# Patient Record
Sex: Male | Born: 1955 | Race: White | Hispanic: No | Marital: Married
Health system: Southern US, Community
[De-identification: ages and names within clinical notes are randomized; demographics above are authoritative.]

---

## 2008-10-11 ENCOUNTER — Ambulatory Visit: Payer: Self-pay | Admitting: Cardiology

## 2008-10-11 ENCOUNTER — Emergency Department (HOSPITAL_COMMUNITY): Admission: EM | Admit: 2008-10-11 | Discharge: 2008-10-11 | Payer: Self-pay | Admitting: Emergency Medicine

## 2008-10-17 ENCOUNTER — Telehealth: Payer: Self-pay | Admitting: Cardiology

## 2008-10-21 ENCOUNTER — Encounter: Payer: Self-pay | Admitting: Cardiology

## 2008-10-21 ENCOUNTER — Ambulatory Visit: Payer: Self-pay

## 2008-10-23 ENCOUNTER — Observation Stay (HOSPITAL_COMMUNITY): Admission: EM | Admit: 2008-10-23 | Discharge: 2008-10-24 | Payer: Self-pay | Admitting: Emergency Medicine

## 2008-10-24 ENCOUNTER — Ambulatory Visit: Payer: Self-pay

## 2008-10-24 ENCOUNTER — Encounter: Payer: Self-pay | Admitting: Cardiovascular Disease

## 2008-10-27 ENCOUNTER — Telehealth: Payer: Self-pay | Admitting: Cardiology

## 2008-10-30 ENCOUNTER — Ambulatory Visit: Payer: Self-pay | Admitting: Cardiology

## 2008-10-30 ENCOUNTER — Encounter (INDEPENDENT_AMBULATORY_CARE_PROVIDER_SITE_OTHER): Payer: Self-pay | Admitting: *Deleted

## 2008-10-30 DIAGNOSIS — I10 Essential (primary) hypertension: Secondary | ICD-10-CM | POA: Insufficient documentation

## 2008-10-30 DIAGNOSIS — R55 Syncope and collapse: Secondary | ICD-10-CM | POA: Insufficient documentation

## 2008-10-30 DIAGNOSIS — R079 Chest pain, unspecified: Secondary | ICD-10-CM | POA: Insufficient documentation

## 2008-10-31 ENCOUNTER — Ambulatory Visit: Payer: Self-pay | Admitting: Cardiology

## 2008-10-31 ENCOUNTER — Encounter: Payer: Self-pay | Admitting: Cardiology

## 2008-11-03 LAB — CONVERTED CEMR LAB
Albumin: 4.3 g/dL (ref 3.5–5.2)
Alkaline Phosphatase: 48 units/L (ref 39–117)
BUN: 16 mg/dL (ref 6–23)
Basophils Absolute: 0 10*3/uL (ref 0.0–0.1)
Basophils Relative: 0 % (ref 0–1)
Bilirubin, Direct: 0.2 mg/dL (ref 0.0–0.3)
CO2: 27 meq/L (ref 19–32)
Chloride: 105 meq/L (ref 96–112)
Creatinine, Ser: 0.9 mg/dL (ref 0.40–1.50)
Eosinophils Absolute: 0.1 10*3/uL (ref 0.0–0.7)
Eosinophils Relative: 1 % (ref 0–5)
Glucose, Bld: 95 mg/dL (ref 70–99)
HCT: 41.5 % (ref 39.0–52.0)
HDL: 71 mg/dL (ref >39)
Hemoglobin: 14 g/dL (ref 13.0–17.0)
INR: 1.1 (ref 0.0–1.5)
LDL Cholesterol: 88 mg/dL (ref 0–99)
MCHC: 33.7 g/dL (ref 30.0–36.0)
Monocytes Absolute: 0.6 10*3/uL (ref 0.1–1.0)
Platelets: 297 10*3/uL (ref 150–400)
RDW: 12.3 % (ref 11.5–15.5)
Total Bilirubin: 1.1 mg/dL (ref 0.3–1.2)

## 2008-11-07 ENCOUNTER — Inpatient Hospital Stay (HOSPITAL_BASED_OUTPATIENT_CLINIC_OR_DEPARTMENT_OTHER): Admission: RE | Admit: 2008-11-07 | Discharge: 2008-11-07 | Payer: Self-pay | Admitting: Cardiology

## 2008-11-07 ENCOUNTER — Ambulatory Visit: Payer: Self-pay | Admitting: Cardiology

## 2008-11-19 ENCOUNTER — Ambulatory Visit: Payer: Self-pay | Admitting: Cardiology

## 2010-07-18 LAB — POCT CARDIAC MARKERS
CKMB, poc: <1 ng/mL — ABNORMAL LOW (ref 1.0–8.0)
CKMB, poc: <1 ng/mL — ABNORMAL LOW (ref 1.0–8.0)
Troponin i, poc: <0.05 ng/mL (ref 0.00–0.09)

## 2010-07-19 LAB — DIFFERENTIAL
Basophils Absolute: 0 10*3/uL (ref 0.0–0.1)
Basophils Relative: 0 % (ref 0–1)
Eosinophils Relative: 1 % (ref 0–5)
Eosinophils Relative: 2 % (ref 0–5)
Lymphocytes Relative: 17 % (ref 12–46)
Lymphs Abs: 1.4 10*3/uL (ref 0.7–4.0)
Monocytes Absolute: 0.2 10*3/uL (ref 0.1–1.0)
Monocytes Absolute: 0.8 10*3/uL (ref 0.1–1.0)
Neutro Abs: 5.9 10*3/uL (ref 1.7–7.7)

## 2010-07-19 LAB — POCT I-STAT, CHEM 8
Calcium, Ion: 1.19 mmol/L (ref 1.12–1.32)
HCT: 44 % (ref 39.0–52.0)
Hemoglobin: 15 g/dL (ref 13.0–17.0)
TCO2: 28 mmol/L (ref 0–100)

## 2010-07-19 LAB — CBC
HCT: 41.2 % (ref 39.0–52.0)
HCT: 43.4 % (ref 39.0–52.0)
Hemoglobin: 14.4 g/dL (ref 13.0–17.0)
Hemoglobin: 14.8 g/dL (ref 13.0–17.0)
MCHC: 34 g/dL (ref 30.0–36.0)
Platelets: 317 10*3/uL (ref 150–400)
RDW: 12.1 % (ref 11.5–15.5)
WBC: 8.2 10*3/uL (ref 4.0–10.5)

## 2010-07-19 LAB — POCT CARDIAC MARKERS
Myoglobin, poc: 75.1 ng/mL (ref 12–200)
Troponin i, poc: <0.05 ng/mL (ref 0.00–0.09)

## 2010-07-19 LAB — PROTIME-INR: Prothrombin Time: 15.3 seconds — ABNORMAL HIGH (ref 11.6–15.2)

## 2010-07-19 LAB — APTT: aPTT: 30 seconds (ref 24–37)

## 2010-07-23 IMAGING — CT CT HEART MORP W/ CTA COR W/ SCORE W/ CA W/CM &/OR W/O CM
2 of 5 series · 11 of 20 positions shown, 12 images · IV contrast (omnipaque)
Comparison: Plain film of yesterday.

INDICATION: Chest discomfort

CT ANGIOGRAPHY OF THE HEART, CORONOARY ARTERY, STRUCTURE, AND
MORPHOLOGY
CONTRAST:  100 ml Omnipaque 350
TECHNIQUE: CT angiography of the coronary vessels was performed on
a 64 - channel system using ECG gating.  A scout and  noncontrast
exam (for calcium scoring) were performed.  Circulation time was
measured using a test bolus.  Coronary CTA was performed with sub
mm slice collimation during portions of the cardiac cycle after
prior injection of iodinated contrast.  Imaging post processing was
performed on an independent workstation creating multiplanar, 3-D,
and quantitative analysis of the heart and coronary arteries.  Note
that this exam targets the heart and the chest was not imaged in
its entirety.
PREMEDICATION:
Lopressor 175 mg, P.O.
Lopressor 5 mg, IV
Nitroglycerin 0.4 mcg, sublingual.

[Series 3: calcium score 3.0 b35f 65% · axial · 0.49mm/px · z∈[-360,-130]mm · 3 of 78 slices shown, 4 images]
[im 1/78  vessel]
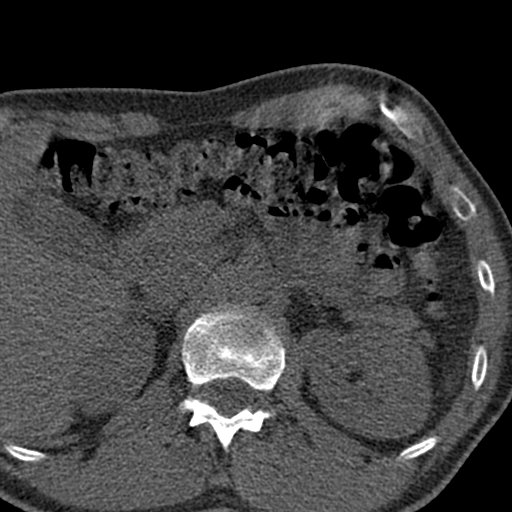
[im 1/78  lung]
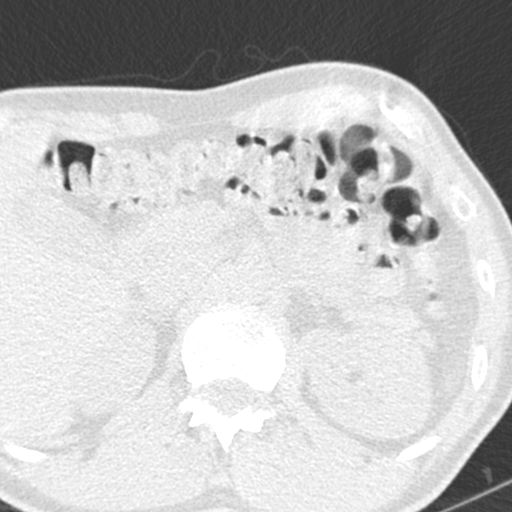
[im 39/78  vessel]
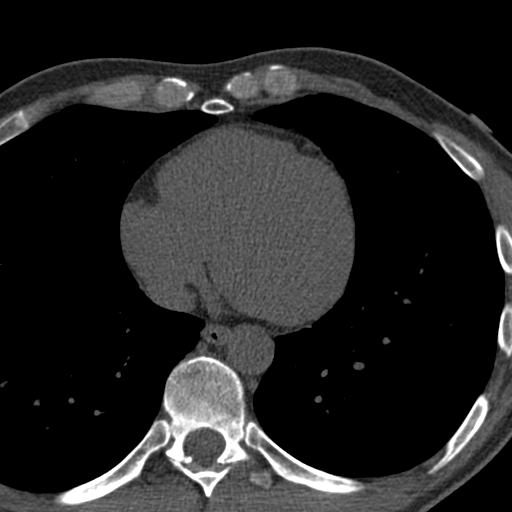
[im 78/78  vessel]
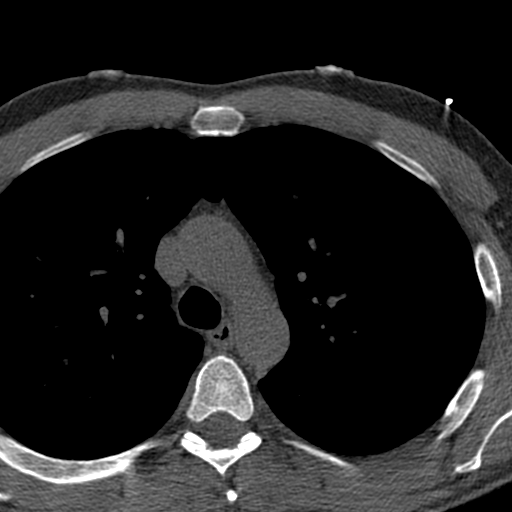

[Series 6: coronary cta 0.6 b25f 65% · axial · 0.47mm/px · z∈[-321,-175]mm · 8 of 300 slices shown]
[im 28/300  vessel]
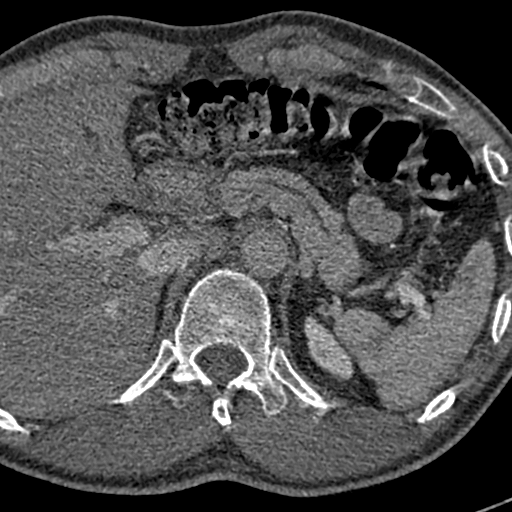
[im 55/300  vessel]
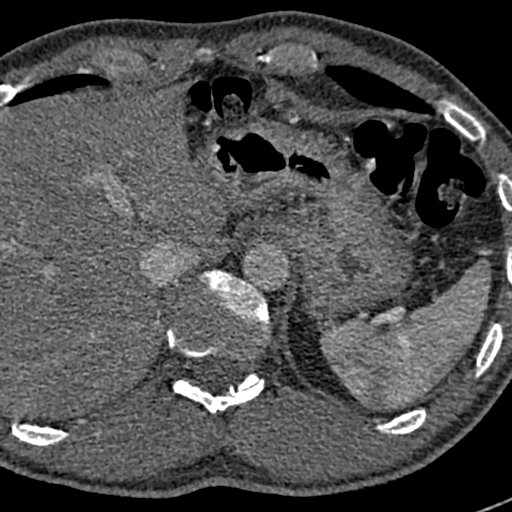
[im 109/300  vessel]
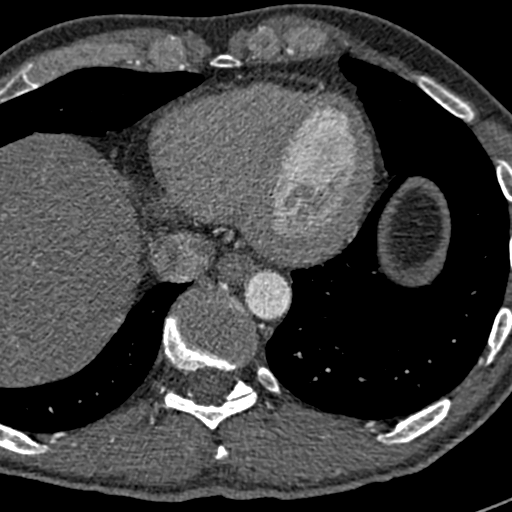
[im 136/300  vessel]
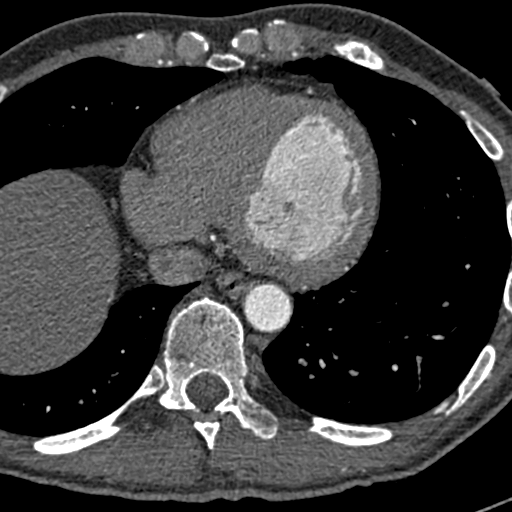
[im 164/300  vessel]
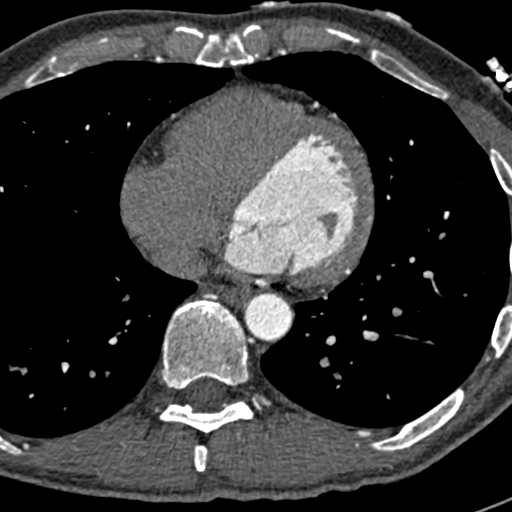
[im 191/300  vessel]
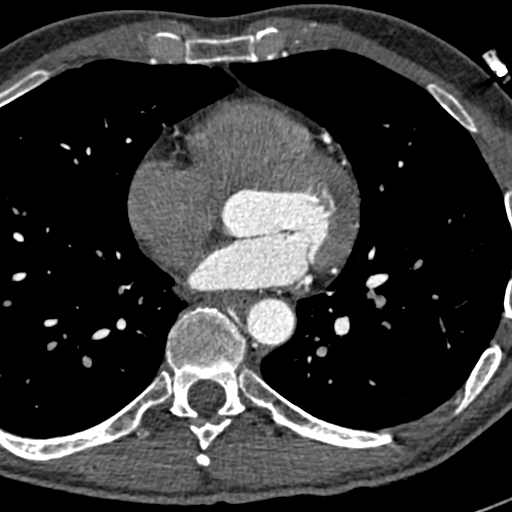
[im 245/300  vessel]
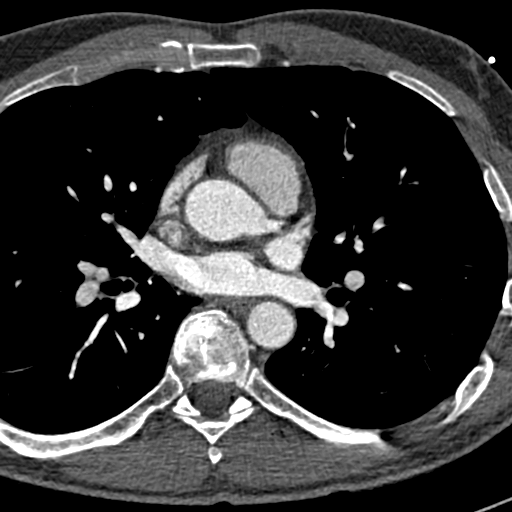
[im 272/300  vessel]
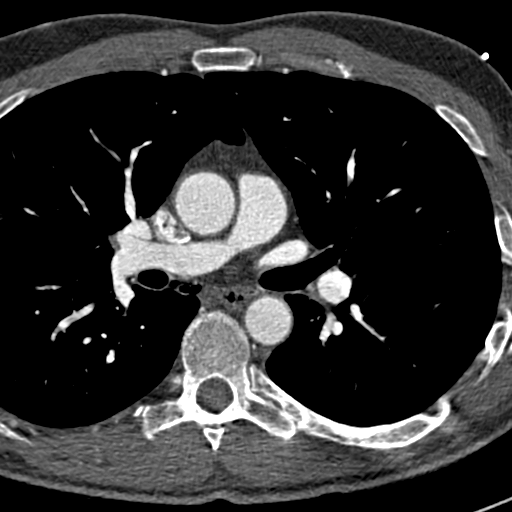

[11 of 20 positions shown; findings below may reference images not displayed]

FINDINGS: Technical quality:  Moderate

CORONARY ARTERIES:
Left main coronary artery:  A short vessel which arises from the
left coronary cusp.  No evidence of significant stenosis.  In the
region of the bifurcation into the LAD and left circumflex, there
is an area of heterogeneous density which is felt to represent
misregistration artifact.
Left anterior descending:  Long vessel which supplies cardiac apex.
Numerous foci of misregistration artifact, due to the patient's
high heart rate,.  Other than these areas of apparent irregularity,
no evidence of significant stenosis.

There is one tiny proximal diagonal branch and a moderate sized D2
branch.  The latter is branching,  also has areas of mild
misregistration but no significant stenosis. The small ramus branch
is also mildly artifact limited.  No significant stenosis
identified.
Left circumflex:  A large, dominant vessel.  No significant
misregistration artifact throughout its proximal to mid course.
Apparent irregularity about the inferior distal course is due to
misregistration artifact.  See PACS image. Supplies numerous
posterolateral branches.

Gives rise to a proximal moderate to large marginal branch.  This
supplies a portion of the left ventricular free wall and is without
significant stenosis proximally.  More distally, it is patent but
degraded by misregistration artifact.
Right coronary artery:  A small nondominant vessel.  Arises
normally from the right coronary cusp.  No significant stenosis in
its proximal portion.   Extensive artifact distally.  Within its
mid course gives rise to a marginal branch and continues as a
diminutive vessel in its mid to distal course.  The marginal branch
is small but patent.
Posterior descending artery:  Small moderate in size.  Supplied by
the distal left circ
Dominance:  Left-sided.

CORONARY CALCIUM:
Total Agatston Score:  0
[HOSPITAL] percentile:  0

CARDIAC MEASUREMENTS:
Interventricular septum (6 - 12 mm):  8
LV posterior wall (6 - 12 mm):  8
LV diameter in diastole (35 - 52 mm):  50

AORTA AND PULMONARY MEASUREMENTS:
Aortic root (21 - 40 mm):
            29  at the annulus
            36  at the sinuses of Valsalva
            26  at the sinotubular junction
Ascending aorta ( <  40 mm):  27
Descending aorta ( <  40 mm):  21
Main pulmonary artery:  ( <  30 mm):  21

EXTRACARDIAC FINDINGS:
Lung windows demonstrate minimal motion artifact.  No nodules
airspace opacities.

Soft tissue windows demonstrate No pericardial or pleural effusion.
No mediastinal or hilar adenopathy. No central pulmonary embolism.

Limited abdominal imaging demonstrates lateral segment left liver
lobe cyst which measures approximately 1 cm.  Under distended
proximal stomach.  No acute osseous abnormality. Underdistension of
the proximal stomach.  Cannot exclude mild distal esophageal and
proximal gastric wall thickening including on image 44 of series 7.
IMPRESSION: 1.  The patient's total coronary artery calcium score is 0, which
is 0 percentile for patient's matched age,  gender and
race/ethnicity.
2.  Moderately degraded exam due to numerous foci of
misregistration artifact.  This is secondary to patient's heart
rate, which is 72 during the exam, despite near maximal dose of
oral and IV Lopressor and 2 mg of Ativan.  The misregistration
artifact primarily involves the right coronary artery and mid to
distal LAD.  In the areas that are not degraded, there is no
evidence of significant stenosis.

3.  Left-sided coronary artery dominance.
4.  Possible distal esophageal and/or proximal gastric wall
thickening.  Correlate with if  patient's symptoms could be
attributed to esophagitis patchy gastritis.

I called and personally discussed this report with Dr. Sameena Rasmussen

## 2010-08-24 NOTE — Consult Note (Signed)
NAME:  William Holloway, William Holloway NO.:  [id number]   MEDICAL RECORD NO.:  [id number]          PATIENT TYPE:  EMS   LOCATION:  [facility]                         FACILITY:  [facility]   PHYSICIAN:  Marca Ancona, MD      DATE OF BIRTH:  February 01, 1956   DATE OF CONSULTATION:  10/11/2008  DATE OF DISCHARGE:  10/11/2008                                 CONSULTATION   PRIMARY CARE PHYSICIAN:  The patient does not have a primary care  physician.   HISTORY OF PRESENT ILLNESS:  This is a 55 year old with no significant  past medical history who came to the emergency department today after a  syncopal episode.  The patient went to ophthalmologist today for his  regular eye checkup.  He did not eat anything at all this morning.  They  puffed his eyes with air to check the glaucoma.  He found this  unpleasant and developed some nausea and ooziness after they did it.  After the exam, he stood up and the lightheadedness and ooziness that he  had got worse as he walked to the door of the room he passed out, fell  forward hitting his face and fracturing a nasal bone.  The patient was  unconscious only for a few seconds.  When EMS arrived, he was awake and  his heart rate was in the 40s.  Currently in the emergency department,  his heart rate in the 60-70, his blood pressure initially 180 and now it  is down to 120.  Telemetry has been normal in the emergency department.  His EKG is completely normal.  The patient feels fine now.  At baseline,  he has good exercise tolerance.  No chest pain or shortness of breath  with exertion.   MEDICATIONS:  None.   PAST MEDICAL HISTORY:  None related though the patient possibly has  hypertension.   SOCIAL HISTORY:  The patient lives in [place] with his wife.  He is an  Art gallery manager at [employer].  He does not smoke.  Rarely drinks alcohol.  No  drugs.   FAMILY HISTORY:  The patient's father had an MI at age of 71.  His  sister has hypertension.   REVIEW OF  SYSTEMS:  Negative except as stated in history of present  illness.   PHYSICAL EXAMINATION:  VITAL SIGNS:  The patient is afebrile.  Heart  rate is in the 60s-70s in sinus rhythm.  Blood pressure was initially  185/73, now 120/80. Oxygen saturation 100% on room air.  GENERAL:  No apparent distress.  HEENT:  The patient has some epistaxis from where he fell.  ABDOMEN:  Soft and nontender.  No hepatosplenomegaly.  Normal bowel  sounds.  NECK:  No thyromegaly.  No thyroid nodule.  There is no JVD.  CARDIOVASCULAR:  Heart, regular S1 and S2.  No S3.  There is soft S4.  There is no murmur.  Pulses are 2+ and equal without bruits.  EXTREMITY:  No clubbing or cyanosis.  No edema.  MUSCULOSKELETAL:  Normal exam.  LUNGS:  Clear to auscultation bilaterally.  NEUROLOGIC:  Alert and oriented x3.  Normal affect.   Radiology, CT maxillofacial shows nondisplaced nasal bone fracture.  CT  of the head shows no CNS bleeding.  EKG, normal sinus rhythm.  This is  normal EKG.  Labs:  Hematocrit 43.4, potassium 4.6, creatinine 1.1.  Cardiac enzymes were negative x1.   ASSESSMENT AND PLAN:  This 55 year old with no significant past medical  history presented after a syncopal episode.  1. Syncope is likely vasovagal that occurred with a prodrome after an      unpleasant sensation (puff of air in eyes).  Heart rate was in the      40s when the EMS arrived,  however, it has been normal in ER.      Telemetries has been normal and EKG is completely normal.  There is      no murmur on exam.  I think it is okay for the patient be      discharged home.  He can follow up as an outpatient.  He should      have an echocardiogram in 48-hour Holter monitor done as an      outpatient.  He can follow up in our office.  No driving for 3 days      to ensure no recurrence of syncope.  2. Hypertension.  The patient's blood pressure was initially as high      as 180 is now 120/80.  He carries no known history of  hypertension.      He does have an S4, this will need to be monitored closely.      Marca Ancona, MD  Electronically Signed     DM/MEDQ  D:  10/11/2008  T:  10/12/2008  Job:  [id number]

## 2010-08-24 NOTE — Cardiovascular Report (Signed)
NAME:  William Holloway, William Holloway NO.:  [id number]   MEDICAL RECORD NO.:  [id number]          PATIENT TYPE:  OIB   LOCATION:  1961                         FACILITY:  [facility]   PHYSICIAN:  Marca Ancona, MD      DATE OF BIRTH:  [birth year]   DATE OF PROCEDURE:  DATE OF DISCHARGE:                            CARDIAC CATHETERIZATION   PROCEDURE:  1. Left heart catheterization.  2. Coronary angiography  3. Left ventriculography.   INDICATIONS:  Chest pain and abnormal stress echo in a person with a  history of multiple episodes of syncope.   PROCEDURE NOTE:  After informed consent was obtained, the right groin  was sterilely prepped and draped.  A 1% lidocaine was used to locally  anesthetize the right groin area.  The right common femoral artery was  entered using Seldinger technique and a 4-French arterial sheath was  placed.  The left coronary artery was engaged with the 4-French JL-4  catheter.  The right coronary artery was engaged with the 4-French 3DRC  catheter and the left ventricle was entered using a 4-French angled  pigtail catheter.  There were no complications.   FINDINGS:  1. Hemodynamics:  LV 115/14, aorta 115/70.  2. Left ventriculography:  EF was 60%.  There is no mitral      regurgitation.  There is normal wall motion.  3. Coronary artery system:  The coronary system was left dominant.      There is a large left PDA.  The RCA was a small nondominant vessel.      The left main was a short vessel.  There was no angiographic      coronary artery disease.   ASSESSMENT:  No angiographic coronary disease.  Normal left ventricular  systolic function.  The patient's chest pain is likely noncardiac and  the stress echo was likely a false positive.      Marca Ancona, MD  Electronically Signed     DM/MEDQ  D:  11/07/2008  T:  11/07/2008  Job:  [id number]

## 2010-12-05 ENCOUNTER — Other Ambulatory Visit: Payer: Self-pay | Admitting: Cardiology

## 2011-12-30 ENCOUNTER — Other Ambulatory Visit: Payer: Self-pay | Admitting: Cardiology

## 2012-06-24 ENCOUNTER — Other Ambulatory Visit: Payer: Self-pay | Admitting: Cardiology

## 2012-07-06 ENCOUNTER — Other Ambulatory Visit: Payer: Self-pay | Admitting: Cardiology

## 2012-07-27 ENCOUNTER — Telehealth: Payer: Self-pay | Admitting: Cardiology

## 2012-07-27 NOTE — Telephone Encounter (Signed)
Spoke with pt wife, the pt has not been seen since 2010. He wanted to know the policy for refills. Explained the pt will need to make an appt to be seen for refills to cont. Wife voiced understanding and will have the pt call for appt.

## 2012-07-27 NOTE — Telephone Encounter (Signed)
New Prob   Has a questions regarding METOPROLOL medication. Would like to speak to nurse.

## 2019-06-13 ENCOUNTER — Ambulatory Visit: Payer: Self-pay | Attending: Internal Medicine

## 2019-06-13 DIAGNOSIS — Z23 Encounter for immunization: Secondary | ICD-10-CM

## 2019-06-13 NOTE — Progress Notes (Signed)
   Covid-19 Vaccination Clinic  Name:  William Holloway    MRN: [id number] DOB: Jul 26, 1955  06/13/2019  Mr. William Holloway was observed post Covid-19 immunization for 15 minutes without incident. He was provided with Vaccine Information Sheet and instruction to access the V-Safe system.   Mr. William Holloway was instructed to call 911 with any severe reactions post vaccine: Marland Kitchen Difficulty breathing  . Swelling of face and throat  . A fast heartbeat  . A bad rash all over body  . Dizziness and weakness   Immunizations Administered    Name Date Dose VIS Date Route   Pfizer COVID-19 Vaccine 06/13/2019 11:31 AM 0.3 mL 03/22/2019 Intramuscular   Manufacturer: [place]   Lot: [id number]   [place]: [id number]

## 2019-07-05 ENCOUNTER — Ambulatory Visit: Payer: Self-pay | Attending: Internal Medicine

## 2019-07-05 DIAGNOSIS — Z23 Encounter for immunization: Secondary | ICD-10-CM

## 2019-07-05 NOTE — Progress Notes (Signed)
   Covid-19 Vaccination Clinic  Name:  William Holloway    MRN: [id number] DOB: April 25, 1955  07/05/2019  [name] was observed post Covid-19 immunization for 15 minutes without incident. He was provided with Vaccine Information Sheet and instruction to access the V-Safe system.   [name] was instructed to call 911 with any severe reactions post vaccine: Marland Kitchen Difficulty breathing  . Swelling of face and throat  . A fast heartbeat  . A bad rash all over body  . Dizziness and weakness   Immunizations Administered    Name Date Dose VIS Date Route   Pfizer COVID-19 Vaccine 07/05/2019 12:25 PM 0.3 mL 03/22/2019 Intramuscular   Manufacturer: [place]   Lot: [id number]   [place]: [id number]

## 2020-05-15 DIAGNOSIS — Z20822 Contact with and (suspected) exposure to covid-19: Secondary | ICD-10-CM | POA: Diagnosis not present

## 2020-08-07 DIAGNOSIS — L819 Disorder of pigmentation, unspecified: Secondary | ICD-10-CM | POA: Diagnosis not present

## 2020-08-07 DIAGNOSIS — L814 Other melanin hyperpigmentation: Secondary | ICD-10-CM | POA: Diagnosis not present

## 2020-08-07 DIAGNOSIS — D485 Neoplasm of uncertain behavior of skin: Secondary | ICD-10-CM | POA: Diagnosis not present

## 2020-08-07 DIAGNOSIS — L570 Actinic keratosis: Secondary | ICD-10-CM | POA: Diagnosis not present

## 2020-08-07 DIAGNOSIS — D171 Benign lipomatous neoplasm of skin and subcutaneous tissue of trunk: Secondary | ICD-10-CM | POA: Diagnosis not present

## 2020-08-07 DIAGNOSIS — C44719 Basal cell carcinoma of skin of left lower limb, including hip: Secondary | ICD-10-CM | POA: Diagnosis not present

## 2020-08-07 DIAGNOSIS — D1801 Hemangioma of skin and subcutaneous tissue: Secondary | ICD-10-CM | POA: Diagnosis not present

## 2020-08-07 DIAGNOSIS — D2261 Melanocytic nevi of right upper limb, including shoulder: Secondary | ICD-10-CM | POA: Diagnosis not present

## 2020-08-07 DIAGNOSIS — L821 Other seborrheic keratosis: Secondary | ICD-10-CM | POA: Diagnosis not present

## 2020-08-07 DIAGNOSIS — C44519 Basal cell carcinoma of skin of other part of trunk: Secondary | ICD-10-CM | POA: Diagnosis not present

## 2020-08-07 DIAGNOSIS — Z85820 Personal history of malignant melanoma of skin: Secondary | ICD-10-CM | POA: Diagnosis not present

## 2020-10-19 DIAGNOSIS — R030 Elevated blood-pressure reading, without diagnosis of hypertension: Secondary | ICD-10-CM | POA: Diagnosis not present

## 2020-10-21 DIAGNOSIS — Z23 Encounter for immunization: Secondary | ICD-10-CM | POA: Diagnosis not present

## 2020-10-21 DIAGNOSIS — Z0000 Encounter for general adult medical examination without abnormal findings: Secondary | ICD-10-CM | POA: Diagnosis not present

## 2020-10-22 DIAGNOSIS — Z23 Encounter for immunization: Secondary | ICD-10-CM | POA: Diagnosis not present

## 2020-11-13 DIAGNOSIS — D1721 Benign lipomatous neoplasm of skin and subcutaneous tissue of right arm: Secondary | ICD-10-CM | POA: Diagnosis not present

## 2020-12-29 DIAGNOSIS — D1721 Benign lipomatous neoplasm of skin and subcutaneous tissue of right arm: Secondary | ICD-10-CM | POA: Diagnosis not present

## 2021-02-09 DIAGNOSIS — L720 Epidermal cyst: Secondary | ICD-10-CM | POA: Diagnosis not present

## 2021-02-09 DIAGNOSIS — L723 Sebaceous cyst: Secondary | ICD-10-CM | POA: Diagnosis not present

## 2021-02-24 DIAGNOSIS — D225 Melanocytic nevi of trunk: Secondary | ICD-10-CM | POA: Diagnosis not present

## 2021-02-24 DIAGNOSIS — L570 Actinic keratosis: Secondary | ICD-10-CM | POA: Diagnosis not present

## 2021-02-24 DIAGNOSIS — L814 Other melanin hyperpigmentation: Secondary | ICD-10-CM | POA: Diagnosis not present

## 2021-02-24 DIAGNOSIS — D2261 Melanocytic nevi of right upper limb, including shoulder: Secondary | ICD-10-CM | POA: Diagnosis not present

## 2021-02-24 DIAGNOSIS — L820 Inflamed seborrheic keratosis: Secondary | ICD-10-CM | POA: Diagnosis not present

## 2021-02-24 DIAGNOSIS — L905 Scar conditions and fibrosis of skin: Secondary | ICD-10-CM | POA: Diagnosis not present

## 2021-02-24 DIAGNOSIS — Z85820 Personal history of malignant melanoma of skin: Secondary | ICD-10-CM | POA: Diagnosis not present

## 2021-02-24 DIAGNOSIS — L819 Disorder of pigmentation, unspecified: Secondary | ICD-10-CM | POA: Diagnosis not present

## 2021-02-24 DIAGNOSIS — D1801 Hemangioma of skin and subcutaneous tissue: Secondary | ICD-10-CM | POA: Diagnosis not present

## 2021-08-25 DIAGNOSIS — D225 Melanocytic nevi of trunk: Secondary | ICD-10-CM | POA: Diagnosis not present

## 2021-08-25 DIAGNOSIS — Z85820 Personal history of malignant melanoma of skin: Secondary | ICD-10-CM | POA: Diagnosis not present

## 2021-08-25 DIAGNOSIS — C44519 Basal cell carcinoma of skin of other part of trunk: Secondary | ICD-10-CM | POA: Diagnosis not present

## 2021-08-25 DIAGNOSIS — L570 Actinic keratosis: Secondary | ICD-10-CM | POA: Diagnosis not present

## 2021-08-25 DIAGNOSIS — L821 Other seborrheic keratosis: Secondary | ICD-10-CM | POA: Diagnosis not present

## 2021-10-27 DIAGNOSIS — Z0000 Encounter for general adult medical examination without abnormal findings: Secondary | ICD-10-CM | POA: Diagnosis not present

## 2021-10-27 DIAGNOSIS — Z85820 Personal history of malignant melanoma of skin: Secondary | ICD-10-CM | POA: Diagnosis not present

## 2021-11-09 DIAGNOSIS — M25511 Pain in right shoulder: Secondary | ICD-10-CM | POA: Diagnosis not present

## 2021-11-09 DIAGNOSIS — S42001A Fracture of unspecified part of right clavicle, initial encounter for closed fracture: Secondary | ICD-10-CM | POA: Diagnosis not present

## 2022-02-28 DIAGNOSIS — L821 Other seborrheic keratosis: Secondary | ICD-10-CM | POA: Diagnosis not present

## 2022-02-28 DIAGNOSIS — Z85828 Personal history of other malignant neoplasm of skin: Secondary | ICD-10-CM | POA: Diagnosis not present

## 2022-02-28 DIAGNOSIS — D225 Melanocytic nevi of trunk: Secondary | ICD-10-CM | POA: Diagnosis not present

## 2022-02-28 DIAGNOSIS — D1801 Hemangioma of skin and subcutaneous tissue: Secondary | ICD-10-CM | POA: Diagnosis not present

## 2022-02-28 DIAGNOSIS — Z85820 Personal history of malignant melanoma of skin: Secondary | ICD-10-CM | POA: Diagnosis not present

## 2022-08-29 DIAGNOSIS — Z85820 Personal history of malignant melanoma of skin: Secondary | ICD-10-CM | POA: Diagnosis not present

## 2022-08-29 DIAGNOSIS — L821 Other seborrheic keratosis: Secondary | ICD-10-CM | POA: Diagnosis not present

## 2022-08-29 DIAGNOSIS — L570 Actinic keratosis: Secondary | ICD-10-CM | POA: Diagnosis not present

## 2022-08-29 DIAGNOSIS — Z85828 Personal history of other malignant neoplasm of skin: Secondary | ICD-10-CM | POA: Diagnosis not present

## 2022-08-29 DIAGNOSIS — D0462 Carcinoma in situ of skin of left upper limb, including shoulder: Secondary | ICD-10-CM | POA: Diagnosis not present

## 2022-08-29 DIAGNOSIS — D225 Melanocytic nevi of trunk: Secondary | ICD-10-CM | POA: Diagnosis not present

## 2022-08-29 DIAGNOSIS — D045 Carcinoma in situ of skin of trunk: Secondary | ICD-10-CM | POA: Diagnosis not present

## 2022-11-01 DIAGNOSIS — Z0000 Encounter for general adult medical examination without abnormal findings: Secondary | ICD-10-CM | POA: Diagnosis not present

## 2022-11-01 DIAGNOSIS — Z13220 Encounter for screening for lipoid disorders: Secondary | ICD-10-CM | POA: Diagnosis not present

## 2022-11-01 DIAGNOSIS — R7309 Other abnormal glucose: Secondary | ICD-10-CM | POA: Diagnosis not present

## 2022-11-01 DIAGNOSIS — Z136 Encounter for screening for cardiovascular disorders: Secondary | ICD-10-CM | POA: Diagnosis not present

## 2023-02-27 DIAGNOSIS — D2261 Melanocytic nevi of right upper limb, including shoulder: Secondary | ICD-10-CM | POA: Diagnosis not present

## 2023-02-27 DIAGNOSIS — L570 Actinic keratosis: Secondary | ICD-10-CM | POA: Diagnosis not present

## 2023-02-27 DIAGNOSIS — D2262 Melanocytic nevi of left upper limb, including shoulder: Secondary | ICD-10-CM | POA: Diagnosis not present

## 2023-02-27 DIAGNOSIS — L821 Other seborrheic keratosis: Secondary | ICD-10-CM | POA: Diagnosis not present

## 2023-02-27 DIAGNOSIS — Z85820 Personal history of malignant melanoma of skin: Secondary | ICD-10-CM | POA: Diagnosis not present

## 2023-02-27 DIAGNOSIS — D225 Melanocytic nevi of trunk: Secondary | ICD-10-CM | POA: Diagnosis not present

## 2023-02-27 DIAGNOSIS — Z85828 Personal history of other malignant neoplasm of skin: Secondary | ICD-10-CM | POA: Diagnosis not present

## 2023-07-28 DIAGNOSIS — H2513 Age-related nuclear cataract, bilateral: Secondary | ICD-10-CM | POA: Diagnosis not present

## 2023-07-28 DIAGNOSIS — H524 Presbyopia: Secondary | ICD-10-CM | POA: Diagnosis not present

## 2023-08-29 DIAGNOSIS — D225 Melanocytic nevi of trunk: Secondary | ICD-10-CM | POA: Diagnosis not present

## 2023-08-29 DIAGNOSIS — Z85828 Personal history of other malignant neoplasm of skin: Secondary | ICD-10-CM | POA: Diagnosis not present

## 2023-08-29 DIAGNOSIS — L821 Other seborrheic keratosis: Secondary | ICD-10-CM | POA: Diagnosis not present

## 2023-08-29 DIAGNOSIS — D0462 Carcinoma in situ of skin of left upper limb, including shoulder: Secondary | ICD-10-CM | POA: Diagnosis not present

## 2023-08-29 DIAGNOSIS — L905 Scar conditions and fibrosis of skin: Secondary | ICD-10-CM | POA: Diagnosis not present

## 2023-08-29 DIAGNOSIS — L570 Actinic keratosis: Secondary | ICD-10-CM | POA: Diagnosis not present

## 2023-08-29 DIAGNOSIS — D485 Neoplasm of uncertain behavior of skin: Secondary | ICD-10-CM | POA: Diagnosis not present

## 2023-10-27 DIAGNOSIS — R7309 Other abnormal glucose: Secondary | ICD-10-CM | POA: Diagnosis not present

## 2023-10-27 DIAGNOSIS — R030 Elevated blood-pressure reading, without diagnosis of hypertension: Secondary | ICD-10-CM | POA: Diagnosis not present

## 2023-10-27 DIAGNOSIS — Z85820 Personal history of malignant melanoma of skin: Secondary | ICD-10-CM | POA: Diagnosis not present

## 2023-10-27 DIAGNOSIS — Z0000 Encounter for general adult medical examination without abnormal findings: Secondary | ICD-10-CM | POA: Diagnosis not present

## 2023-11-29 DIAGNOSIS — L820 Inflamed seborrheic keratosis: Secondary | ICD-10-CM | POA: Diagnosis not present
